# Patient Record
Sex: Female | Born: 1975 | Race: White | Hispanic: Yes | Marital: Married | Smoking: Former smoker
Health system: Southern US, Community
[De-identification: ages and names within clinical notes are randomized; demographics above are authoritative.]

## PROBLEM LIST (undated history)

## (undated) DIAGNOSIS — F419 Anxiety disorder, unspecified: Secondary | ICD-10-CM

## (undated) DIAGNOSIS — D069 Carcinoma in situ of cervix, unspecified: Secondary | ICD-10-CM

## (undated) HISTORY — PX: WISDOM TOOTH EXTRACTION: SHX21

## (undated) HISTORY — PX: LEEP: SHX91

## (undated) HISTORY — DX: Carcinoma in situ of cervix, unspecified: D06.9

## (undated) HISTORY — PX: SIGMOIDOSCOPY: SUR1295

## (undated) HISTORY — DX: Anxiety disorder, unspecified: F41.9

## (undated) HISTORY — PX: HYSTEROSCOPY: SHX211

---

## 1998-08-01 HISTORY — PX: CERVICAL BIOPSY  W/ LOOP ELECTRODE EXCISION: SUR135

## 2008-10-13 ENCOUNTER — Ambulatory Visit: Payer: Self-pay | Admitting: Gynecology

## 2008-10-13 ENCOUNTER — Encounter: Payer: Self-pay | Admitting: Gynecology

## 2008-10-13 ENCOUNTER — Other Ambulatory Visit: Admission: RE | Admit: 2008-10-13 | Discharge: 2008-10-13 | Payer: Self-pay | Admitting: Gynecology

## 2010-04-23 ENCOUNTER — Other Ambulatory Visit: Admission: RE | Admit: 2010-04-23 | Discharge: 2010-04-23 | Payer: Self-pay | Admitting: Gynecology

## 2010-04-23 ENCOUNTER — Ambulatory Visit: Payer: Self-pay | Admitting: Gynecology

## 2010-04-29 ENCOUNTER — Ambulatory Visit: Payer: Self-pay | Admitting: Gynecology

## 2011-04-18 ENCOUNTER — Telehealth: Payer: Self-pay | Admitting: *Deleted

## 2011-04-18 NOTE — Telephone Encounter (Signed)
Pt informed with the below note and will schedule appointment when able.

## 2011-04-18 NOTE — Telephone Encounter (Signed)
Pt called wanting a referral for a GI doctor and referral to exercise stress test that is done at [place]. Pt says she has been having some stomach pain at times. Also states chest pain while at her exercise class. I told pt that she needs to been seen by you,she is due for annual this month. Pt says she will schedule appointment in nov. I explained to pt that it would be best to see you first then you would be gladly give pt referral. Please advise.

## 2011-04-18 NOTE — Telephone Encounter (Signed)
Message copied by Aura Camps on Mon Apr 18, 2011 12:29 PM ------      Message from: Ok Edwards      Created: Mon Apr 18, 2011 12:20 PM       Victorino Dike, please tell patient that before can refer her to the gastroenterologists were cardiologist I need to evaluate her first and order some preliminary lab work as well. And she stated you know she is overdue for her annual exam which can check her at that point. If her annual spatula November she is to schedule an appointment to see me later this week or next week as to the symptoms prior to referral or additional testing.

## 2011-06-06 ENCOUNTER — Other Ambulatory Visit (HOSPITAL_COMMUNITY)
Admission: RE | Admit: 2011-06-06 | Discharge: 2011-06-06 | Disposition: A | Discharge status: Home / Self Care | Payer: BC Managed Care – PPO | Source: Ambulatory Visit | Attending: Obstetrics and Gynecology | Admitting: Obstetrics and Gynecology

## 2011-06-06 ENCOUNTER — Other Ambulatory Visit: Payer: Self-pay | Admitting: Obstetrics and Gynecology

## 2011-06-06 DIAGNOSIS — Z01419 Encounter for gynecological examination (general) (routine) without abnormal findings: Secondary | ICD-10-CM | POA: Insufficient documentation

## 2011-06-06 DIAGNOSIS — Z113 Encounter for screening for infections with a predominantly sexual mode of transmission: Secondary | ICD-10-CM | POA: Insufficient documentation

## 2012-06-07 ENCOUNTER — Encounter: Payer: BC Managed Care – PPO | Admitting: Gynecology

## 2012-06-19 ENCOUNTER — Other Ambulatory Visit (HOSPITAL_COMMUNITY)
Admission: RE | Admit: 2012-06-19 | Discharge: 2012-06-19 | Disposition: A | Discharge status: Home / Self Care | Payer: BC Managed Care – PPO | Source: Ambulatory Visit | Attending: Gynecology | Admitting: Gynecology

## 2012-06-19 ENCOUNTER — Ambulatory Visit (INDEPENDENT_AMBULATORY_CARE_PROVIDER_SITE_OTHER): Payer: BC Managed Care – PPO | Admitting: Gynecology

## 2012-06-19 ENCOUNTER — Encounter: Payer: Self-pay | Admitting: Gynecology

## 2012-06-19 VITALS — BP 104/68 | Ht 67.5 in | Wt 168.0 lb

## 2012-06-19 DIAGNOSIS — Z01419 Encounter for gynecological examination (general) (routine) without abnormal findings: Secondary | ICD-10-CM | POA: Insufficient documentation

## 2012-06-19 DIAGNOSIS — D069 Carcinoma in situ of cervix, unspecified: Secondary | ICD-10-CM | POA: Insufficient documentation

## 2012-06-19 DIAGNOSIS — K5900 Constipation, unspecified: Secondary | ICD-10-CM

## 2012-06-19 DIAGNOSIS — K602 Anal fissure, unspecified: Secondary | ICD-10-CM

## 2012-06-19 DIAGNOSIS — Z113 Encounter for screening for infections with a predominantly sexual mode of transmission: Secondary | ICD-10-CM

## 2012-06-19 DIAGNOSIS — Z1151 Encounter for screening for human papillomavirus (HPV): Secondary | ICD-10-CM | POA: Insufficient documentation

## 2012-06-19 NOTE — Progress Notes (Signed)
Helen Schmitt [birth year] [id number]   History:    36 y.o.  for annual gyn exam who has not been seen the office for several years. She stated that her last annual exam and Pap smear were normal in April 2012 hearing [place]. She states her cycles are regular. She in frequently does her self breast examination. She is using condoms for contraception. 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 She declined flu vaccine but was requesting a full STD screen. She stated that her family doctor at [facility] Dr. Leonette Most had done her lab work recently. She was also complaining of questionable hemorrhoids and constipation.  Past medical history,surgical history, family history and social history were all reviewed and documented in the EPIC chart.  Gynecologic History Patient's last menstrual period was 06/07/2012. Contraception: condoms Last Pap: 2012. Results were: normal Last mammogram: Not indicated. Results were: Not indicated  Obstetric History OB History    Grav Para Term Preterm Abortions TAB SAB Ect Mult Living   0                ROS: A ROS was performed and pertinent positives and negatives are included in the history.  GENERAL: No fevers or chills. HEENT: No change in vision, no earache, sore throat or sinus congestion. NECK: No pain or stiffness. CARDIOVASCULAR: No chest pain or pressure. No palpitations. PULMONARY: No shortness of breath, cough or wheeze. GASTROINTESTINAL: No abdominal pain, nausea, vomiting or diarrhea, melena or bright red blood per rectum. GENITOURINARY: No urinary frequency, urgency, hesitancy or dysuria. MUSCULOSKELETAL: No joint or muscle pain, no back pain, no recent trauma. DERMATOLOGIC: No rash, no itching, no lesions. ENDOCRINE: No polyuria, polydipsia, no heat or cold intolerance. No recent change in weight.  HEMATOLOGICAL: No anemia or easy bruising or bleeding. NEUROLOGIC: No headache, seizures, numbness, tingling or weakness. PSYCHIATRIC: No depression, no loss of interest in normal activity or change in sleep pattern.     Exam: chaperone present  BP 104/68  Ht 5' 7.5" (1.715 m)  Wt 168 lb (76.204 kg)  BMI 25.92 kg/m2  LMP 06/07/2012  Body mass index is 25.92 kg/(m^2).  General appearance : Well developed well nourished female. No acute distress HEENT: Neck supple, trachea midline, no carotid bruits, no thyroidmegaly Lungs: Clear to auscultation, no rhonchi or wheezes, or rib retractions  Heart: Regular rate and rhythm, no murmurs or gallops Breast:Examined in sitting and supine position were symmetrical in appearance, no palpable masses or tenderness,  no skin retraction, no nipple inversion, no nipple discharge, no skin discoloration, no axillary or supraclavicular lymphadenopathy Abdomen: no palpable masses or tenderness, no rebound or guarding Extremities: no edema or skin discoloration or tenderness  Pelvic:  Bartholin, Urethra, Skene Glands: Within normal limits             Vagina: No gross lesions or discharge  Cervix: No gross lesions or discharge  Uterus  anteverted, normal size, shape and consistency, non-tender and mobile  Adnexa  Without masses or tenderness  Anus and perineum  normal   Rectovaginal  normal sphincter tone small anal fissure at the 5:00 position slightly bleeding no hemorrhoids noted            Hemoccult not indicated     Assessment/Plan:  36 y.o. female for annual exam with history of constipation and small anal fissure  at the 5:00 position rectal mucosa. Since it was slightly bleeding silver nitrate was used. 2% lidocaine gel was then applied to relieve some of the discomfort. She will be placed on MiraLax to take 1 tablespoon with juice every day. She was instructed to increase her fluid intake as well as a high fiber vegetable diet. She would use  Preparation H twice a day for the next 7-10 days. If her symptoms do not improve she will need to return for further evaluation for possible referral to a gastroenterologist. A GC and Chlamydia culture along with hepatitis B and C. and RPR and HIV were obtained. 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 She was reminded to do her monthly self breast examination. Patient will obtain a copy of her record so that we can evaluate and update our charts.    Ok Edwards MD, 6:03 PM 06/19/2012

## 2012-06-19 NOTE — Patient Instructions (Addendum)
Breast Self-Awareness  Practicing breast self-awareness may pick up problems early, prevent significant medical complications, and possibly save your life. By practicing breast self-awareness, you can become familiar with how your breasts look and feel and if your breasts are changing. This allows you to notice changes early. It can also offer you some reassurance that your breast health is good. One way to learn what is normal for your breasts and whether your breasts are changing is to do a breast self-exam.  If you find a lump or something that was not present in the past, it is best to contact your caregiver right away. Other findings that should be evaluated by your caregiver include nipple discharge, especially if it is bloody; skin changes or reddening; areas where the skin seems to be pulled in (retracted); or new lumps and bumps. Breast pain is seldom associated with cancer (malignancy), but should also be evaluated by a caregiver.  BREAST SELF-EXAM  The best time to examine your breasts is 5 7 days after your menstrual period is over. During menstruation, the breasts are lumpier, and it may be more difficult to pick up changes. If you do not menstruate, have reached menopause, or had your uterus removed (hysterectomy), you should examine your breasts at regular intervals, such as monthly. If you are breastfeeding, examine your breasts after a feeding or after using a breast pump. Breast implants do not decrease the risk for lumps or tumors, so continue to perform breast self-exams as recommended. Talk to your caregiver about how to determine the difference between the implant and breast tissue. Also, talk about the amount of pressure you should use during the exam. Over time, you will become more familiar with the variations of your breasts and more comfortable with the exam. A breast self-exam requires you to remove all your clothes above the waist.    Look at your breasts and nipples. Stand in front of  a mirror in a room with good lighting. With your hands on your hips, push your hands firmly downward. Look for a difference in shape, contour, and size from one breast to the other (asymmetry). Asymmetry includes puckers, dips, or bumps. Also, look for skin changes, such as reddened or scaly areas on the breasts. Look for nipple changes, such as discharge, dimpling, repositioning, or redness.   Carefully feel your breasts. This is best done either in the shower or tub while using soapy water or when flat on your back. Place the arm (on the side of the breast you are examining) above your head. Use the pads (not the fingertips) of your three middle fingers on your opposite hand to feel your breasts. Start in the underarm area and use  inch (2 cm) overlapping circles to feel your breast. Use 3 different levels of pressure (light, medium, and firm pressure) at each circle before moving to the next circle. The light pressure is needed to feel the tissue closest to the skin. The medium pressure will help to feel breast tissue a little deeper, while the firm pressure is needed to feel the tissue close to the ribs. Continue the overlapping circles, moving downward over the breast until you feel your ribs below your breast. Then, move one finger-width towards the center of the body. Continue to use the  inch (2 cm) overlapping circles to feel your breast as you move slowly up toward the collar bone (clavicle) near the base of the neck. Continue the up and down exam using all 3 pressures   the chest. Do this with each breast, carefully feeling for lumps or changes.  Keep a written record with breast changes or normal findings for each breast. By writing this information down, you do not need to depend only on memory for size, tenderness, or location. Write down where you are in your menstrual cycle, if you are still menstruating.  Breast tissue can have some lumps or thick tissue. However,  see your caregiver if you find anything that concerns you.  SEEK MEDICAL CARE IF:  You see a change in shape, contour, or size of your breasts or nipples.   You see skin changes, such as reddened or scaly areas on the breasts or nipples.   You have an unusual discharge from your nipples.   You feel a new lump or unusually thick areas.  Document Released: 07/18/2005 Document Revised: 01/17/2012 Document Reviewed: 11/02/2011 [facility] Patient Information 2013 [place], [place]. Anal Fissure, Adult An anal fissure is a small tear or crack in the skin around the anus. Bleeding from a fissure usually stops on its own within a few minutes. However, bleeding will often reoccur with each bowel movement until the crack heals.  CAUSES   Passing large, hard stools.  Frequent diarrheal stools.  Constipation.  Inflammatory bowel disease (Crohn's disease or ulcerative colitis).  Infections.  Anal sex. SYMPTOMS   Small amounts of blood seen on your stools, on toilet paper, or in the toilet after a bowel movement.  Rectal bleeding.  Painful bowel movements.  Itching or irritation around the anus. DIAGNOSIS Your caregiver will examine the anal area. An anal fissure can usually be seen with careful inspection. A rectal exam may be performed and a short tube (anoscope) may be used to examine the anal canal. TREATMENT   You may be instructed to take fiber supplements. These supplements can soften your stool to help make bowel movements easier.  Sitz baths may be recommended to help heal the tear. Do not use soap in the sitz baths.  A medicated cream or ointment may be prescribed to lessen discomfort. HOME CARE INSTRUCTIONS   Maintain a diet high in fruits, whole grains, and vegetables. Avoid constipating foods like bananas and dairy products.  Take sitz baths as directed by your caregiver.  Drink enough fluids to keep your urine clear or pale yellow.  Only take over-the-counter or  prescription medicines for pain, discomfort, or fever as directed by your caregiver. Do not take aspirin as this may increase bleeding.  Do not use ointments containing numbing medications (anesthetics) or hydrocortisone. They could slow healing. SEEK MEDICAL CARE IF:   Your fissure is not completely healed within 3 days.  You have further bleeding.  You have a fever.  You have diarrhea mixed with blood.  You have pain.  Your problem is getting worse rather than better. MAKE SURE YOU:   Understand these instructions.  Will watch your condition.  Will get help right away if you are not doing well or get worse. Document Released: 07/18/2005 Document Revised: 10/10/2011 Document Reviewed: 01/02/2011 [facility] Patient Information 2013 [place], [place].

## 2012-06-20 LAB — GC/CHLAMYDIA PROBE AMP, GENITAL
Chlamydia, DNA Probe: NEGATIVE
GC Probe Amp, Genital: NEGATIVE

## 2012-06-20 LAB — HEPATITIS C ANTIBODY: HCV Ab: NEGATIVE

## 2012-06-20 LAB — HEPATITIS B SURFACE ANTIGEN: Hepatitis B Surface Ag: NEGATIVE

## 2012-06-25 ENCOUNTER — Telehealth: Payer: Self-pay | Admitting: *Deleted

## 2012-06-25 NOTE — Telephone Encounter (Signed)
Pt informed with all recent lab results on 06/19/12

## 2012-09-15 ENCOUNTER — Other Ambulatory Visit: Payer: Self-pay

## 2013-05-09 ENCOUNTER — Encounter: Payer: Self-pay | Admitting: Women's Health

## 2013-05-09 ENCOUNTER — Ambulatory Visit (INDEPENDENT_AMBULATORY_CARE_PROVIDER_SITE_OTHER): Payer: BC Managed Care – PPO | Admitting: Women's Health

## 2013-05-09 DIAGNOSIS — L723 Sebaceous cyst: Secondary | ICD-10-CM

## 2013-05-09 NOTE — Progress Notes (Signed)
Patient ID: Helen Schmitt, female   DOB: 1976/03/07, 37 y.o.   MRN: [id number] Presents with complaint of painful bump on the left labia. States it comes and goes. Same partner. Denies HSV, vaginal discharge or urinary symptoms. Regular monthly cycle.  Exam: Appears well. External genitalia left labia majora 1 cm tender sebaceous cyst, with pressure exuded small amount of white discharge.  Sebaceous cyst  Plan: Soak in warm tub, loose clothes, call if area persists or does not resolve. Keep scheduled annual exam with Dr. Lily Peer in November.

## 2013-05-10 ENCOUNTER — Ambulatory Visit: Payer: Self-pay | Admitting: Women's Health

## 2013-06-06 ENCOUNTER — Other Ambulatory Visit: Payer: Self-pay

## 2013-06-20 ENCOUNTER — Encounter: Payer: Self-pay | Admitting: Gynecology

## 2013-07-04 ENCOUNTER — Encounter: Payer: Self-pay | Admitting: Gynecology

## 2013-08-09 ENCOUNTER — Ambulatory Visit (INDEPENDENT_AMBULATORY_CARE_PROVIDER_SITE_OTHER): Payer: BC Managed Care – PPO | Admitting: Gynecology

## 2013-08-09 ENCOUNTER — Other Ambulatory Visit (HOSPITAL_COMMUNITY)
Admission: RE | Admit: 2013-08-09 | Discharge: 2013-08-09 | Disposition: A | Discharge status: Home / Self Care | Payer: BC Managed Care – PPO | Source: Ambulatory Visit | Attending: Gynecology | Admitting: Gynecology

## 2013-08-09 ENCOUNTER — Encounter: Payer: Self-pay | Admitting: Gynecology

## 2013-08-09 VITALS — BP 108/68 | Ht 67.0 in

## 2013-08-09 DIAGNOSIS — Z01419 Encounter for gynecological examination (general) (routine) without abnormal findings: Secondary | ICD-10-CM

## 2013-08-09 DIAGNOSIS — K602 Anal fissure, unspecified: Secondary | ICD-10-CM

## 2013-08-09 NOTE — Patient Instructions (Signed)
Anal Fissure, Adult  An anal fissure is a small tear or crack in the skin around the anus. Bleeding from a fissure usually stops on its own within a few minutes. However, bleeding will often reoccur with each bowel movement until the crack heals.   CAUSES    Passing large, hard stools.   Frequent diarrheal stools.   Constipation.   Inflammatory bowel disease (Crohn's disease or ulcerative colitis).   Infections.   Anal sex.  SYMPTOMS    Small amounts of blood seen on your stools, on toilet paper, or in the toilet after a bowel movement.   Rectal bleeding.   Painful bowel movements.   Itching or irritation around the anus.  DIAGNOSIS  Your caregiver will examine the anal area. An anal fissure can usually be seen with careful inspection. A rectal exam may be performed and a short tube (anoscope) may be used to examine the anal canal.  TREATMENT    You may be instructed to take fiber supplements. These supplements can soften your stool to help make bowel movements easier.   Sitz baths may be recommended to help heal the tear. Do not use soap in the sitz baths.   A medicated cream or ointment may be prescribed to lessen discomfort.  HOME CARE INSTRUCTIONS    Maintain a diet high in fruits, whole grains, and vegetables. Avoid constipating foods like bananas and dairy products.   Take sitz baths as directed by your caregiver.   Drink enough fluids to keep your urine clear or pale yellow.   Only take over-the-counter or prescription medicines for pain, discomfort, or fever as directed by your caregiver. Do not take aspirin as this may increase bleeding.   Do not use ointments containing numbing medications (anesthetics) or hydrocortisone. They could slow healing.  SEEK MEDICAL CARE IF:    Your fissure is not completely healed within 3 days.   You have further bleeding.   You have a fever.   You have diarrhea mixed with blood.   You have pain.   Your problem is getting worse rather than  better.  MAKE SURE YOU:    Understand these instructions.   Will watch your condition.   Will get help right away if you are not doing well or get worse.  Document Released: 07/18/2005 Document Revised: 10/10/2011 Document Reviewed: 01/02/2011  [employer] Patient Information 2014 [employer].

## 2013-08-09 NOTE — Progress Notes (Signed)
Helen Schmitt 08/01/1976 [id number]   History:    38 y.o.  for annual gyn exam  with complaint of persistent irritation and discomfort from her anal fissure. She stated that last year she saw a gastroenterologist in  [place], [place] who did a sigmoidoscopy and only described a small internal hemorrhoid and patient was given nitrate glycerin gel to apply but has had minimal improvement.  She is using condoms for contraception. 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 She declined a flu vaccine. She reports normal menstrual cycles.  Past medical history,surgical history, family history and social history were all reviewed and documented in the EPIC chart.  Gynecologic History Patient's last menstrual period was 08/02/2013. Contraception: condoms Last Pap:  2013. Results were: normal Last mammogram:  not indicated. Results were: not indicated   Obstetric History OB History  Gravida Para Term Preterm AB SAB TAB Ectopic Multiple Living  0                  ROS: A ROS was performed and pertinent positives and negatives are included in the history.  GENERAL: No fevers or chills. HEENT: No change in vision, no earache, sore throat or sinus congestion. NECK: No pain or stiffness. CARDIOVASCULAR: No chest pain or pressure. No palpitations. PULMONARY: No shortness of breath, cough or wheeze. GASTROINTESTINAL: No abdominal pain, nausea, vomiting or diarrhea, melena or bright red blood per rectum. GENITOURINARY: No urinary frequency, urgency, hesitancy or dysuria. MUSCULOSKELETAL: No joint or muscle pain, no back pain, no recent trauma. DERMATOLOGIC: No rash, no itching, no lesions. ENDOCRINE: No polyuria, polydipsia, no heat or cold intolerance. No recent change in weight. HEMATOLOGICAL: No anemia or easy bruising or bleeding. NEUROLOGIC: No headache, seizures, numbness, tingling or weakness.  PSYCHIATRIC: No depression, no loss of interest in normal activity or change in sleep pattern.     Exam: chaperone present  BP 108/68  Ht 5\' 7"  (1.702 m)  LMP 08/02/2013  There is no weight on file to calculate BMI.  General appearance : Well developed well nourished female. No acute distress HEENT: Neck supple, trachea midline, no carotid bruits, no thyroidmegaly Lungs: Clear to auscultation, no rhonchi or wheezes, or rib retractions  Heart: Regular rate and rhythm, no murmurs or gallops Breast:Examined in sitting and supine position were symmetrical in appearance, no palpable masses or tenderness,  no skin retraction, no nipple inversion, no nipple discharge, no skin discoloration, no axillary or supraclavicular lymphadenopathy Abdomen: no palpable masses or tenderness, no rebound or guarding Extremities: no edema or skin discoloration or tenderness  Pelvic:  Bartholin, Urethra, Skene Glands: Within normal limits             Vagina: No gross lesions or discharge  Cervix: No gross lesions or discharge  Uterus   anteverted , normal size, shape and consistency, non-tender and mobile  Adnexa  Without masses or tenderness  Anus and perineum :   Physical Exam  Genitourinary:     Rectovaginal  normal sphincter tone without palpated masses or tenderness             Hemoccult  not indicated      Assessment/Plan:  38 y.o. female for annual exam with persistent small fissure at the entrance to the anus. She is going to be referred to a colorectal surgeon. We discussed some treatment options that had been used  [place]seas that has had better results the nitroglycerin gel such as topical bethanechol or diltiazem. Patient is going to wait and wait for the consult with a colorectal surgeon. Her Pap smear was done today. Her PCP has been doing her blood work. We discussed importance of monthly self breast examination.  Note: This dictation was prepared with  Dragon/digital dictation along  withSmart phrase technology. Any transcriptional errors that result from this process are unintentional.   Ok EdwardsFERNANDEZ,JUAN H MD, 5:24 PM 08/09/2013

## 2013-08-15 ENCOUNTER — Telehealth: Payer: Self-pay

## 2013-08-15 NOTE — Telephone Encounter (Signed)
Patient called for pap smear results.  Informed pap normal.

## 2013-08-31 ENCOUNTER — Encounter: Payer: Self-pay | Admitting: *Deleted

## 2018-08-31 ENCOUNTER — Encounter: Payer: Self-pay | Admitting: Obstetrics & Gynecology

## 2018-08-31 ENCOUNTER — Ambulatory Visit (INDEPENDENT_AMBULATORY_CARE_PROVIDER_SITE_OTHER): Payer: 59 | Admitting: Obstetrics & Gynecology

## 2018-08-31 VITALS — BP 118/74 | Ht 67.0 in | Wt 189.5 lb

## 2018-08-31 DIAGNOSIS — Z01419 Encounter for gynecological examination (general) (routine) without abnormal findings: Secondary | ICD-10-CM | POA: Diagnosis not present

## 2018-08-31 DIAGNOSIS — F419 Anxiety disorder, unspecified: Secondary | ICD-10-CM

## 2018-08-31 DIAGNOSIS — Z1151 Encounter for screening for human papillomavirus (HPV): Secondary | ICD-10-CM

## 2018-08-31 DIAGNOSIS — Z3009 Encounter for other general counseling and advice on contraception: Secondary | ICD-10-CM | POA: Diagnosis not present

## 2018-08-31 DIAGNOSIS — E663 Overweight: Secondary | ICD-10-CM

## 2018-08-31 MED ORDER — ALPRAZOLAM 0.5 MG PO TABS: 0.2500 mg | ORAL_TABLET | Freq: Every day | ORAL | 0 refills | Status: DC | PRN

## 2018-08-31 NOTE — Patient Instructions (Signed)
1. Encounter for routine gynecological examination with Papanicolaou smear of cervix Normal gynecologic exam.  Pap with high-risk HPV done today.  Breast exam normal.  Will schedule a screening mammogram at the breast center now.  Will establish with a family physician for health labs.  2. Encounter for other general counseling or advice on contraception Patient using condoms and withdrawal for many years.  Counseling done about use of condoms covering ovulatory period safely.  Better contraceptive methods discussed with patient including ParaGard IUD given that she prefers no hormonal contraception.  ParaGard pamphlet given.  3. Anxiety Rare episodes of anxiety and panic attacks.  Alprazolam 0.5 mg as needed prescribed.  4. Overweight (BMI 25.0-29.9) We will restart on fitness activities, recommend aerobic activities 5 times a week and weightlifting every 2 days.  Recommend a lower calorie/carb diet such as [employer].  Other orders - ALPRAZolam (XANAX) 0.5 MG tablet; Take 0.5 tablets (0.25 mg total) by mouth daily as needed for anxiety.  Helen Schmitt, it was a pleasure meeting you today!  I will inform you of your results as soon as they are available.

## 2018-08-31 NOTE — Addendum Note (Signed)
Addended by: Berna Spare A on: 08/31/2018 11:07 AM   Modules accepted: Orders

## 2018-08-31 NOTE — Progress Notes (Signed)
Helen Schmitt [birth year] [id number]   History:    43 y.o. G0 Married.  Recently moved back to [place]  RP:  New patient presenting for annual gyn exam   HPI: Menses regular every month with normal flow.  No breakthrough bleeding.  No pelvic pain.  No pain with intercourse, except some discomfort if penetration is very deep.  Urine and bowel movements normal.  Breasts normal.  Will find a family physician for health labs.  Will schedule screening mammogram now.  Remote history of LEEPs, per patient around 2007.  Pap tests normal since then.  Past medical history,surgical history, family history and social history were all reviewed and documented in the EPIC chart.  Gynecologic History Patient's last menstrual period was 08/10/2018. Contraception: coitus interruptus and condoms Last Pap: 08/2017. Results were: normal Last mammogram: 2018. Results were: normal Bone Density: Never Colonoscopy: Never  Obstetric History OB History  Gravida Para Term Preterm AB Living  0            SAB TAB Ectopic Multiple Live Births                ROS: A ROS was performed and pertinent positives and negatives are included in the history.  GENERAL: No fevers or chills. HEENT: No change in vision, no earache, sore throat or sinus congestion. NECK: No pain or stiffness. CARDIOVASCULAR: No chest pain or pressure. No palpitations. PULMONARY: No shortness of breath, cough or wheeze. GASTROINTESTINAL: No abdominal pain, nausea, vomiting or diarrhea, melena or bright red blood per rectum. GENITOURINARY: No urinary frequency, urgency, hesitancy or dysuria. MUSCULOSKELETAL: No joint or muscle pain, no back pain, no recent trauma. DERMATOLOGIC: No rash, no itching, no lesions. ENDOCRINE: No polyuria, polydipsia, no heat or cold intolerance. No recent change in weight. HEMATOLOGICAL: No anemia or easy bruising or bleeding. NEUROLOGIC: No headache, seizures, numbness, tingling or weakness. PSYCHIATRIC: No  depression, no loss of interest in normal activity or change in sleep pattern.     Exam:   BP 118/74   Ht 5\' 7"  (1.702 m)   Wt 189 lb 8 oz (86 kg)   LMP 08/10/2018   BMI 29.68 kg/m   Body mass index is 29.68 kg/m.  General appearance : Well developed well nourished female. No acute distress HEENT: Eyes: no retinal hemorrhage or exudates,  Neck supple, trachea midline, no carotid bruits, no thyroidmegaly Lungs: Clear to auscultation, no rhonchi or wheezes, or rib retractions  Heart: Regular rate and rhythm, no murmurs or gallops Breast:Examined in sitting and supine position were symmetrical in appearance, no palpable masses or tenderness,  no skin retraction, no nipple inversion, no nipple discharge, no skin discoloration, no axillary or supraclavicular lymphadenopathy Abdomen: no palpable masses or tenderness, no rebound or guarding Extremities: no edema or skin discoloration or tenderness  Pelvic: Vulva: Normal             Vagina: No gross lesions or discharge  Cervix: No gross lesions or discharge.  Pap/HPV HR done  Uterus  AV, normal size, shape and consistency, non-tender and mobile  Adnexa  Without masses or tenderness  Anus: Normal   Assessment/Plan:  43 y.o. female for annual exam   1. Encounter for routine gynecological examination with Papanicolaou smear of cervix Normal gynecologic exam.  Pap with high-risk HPV done today.  Breast exam normal.  Will schedule a screening mammogram at the breast center now.  Will establish with a family physician for health labs.  2. Encounter for  other general counseling or advice on contraception Patient using condoms and withdrawal for many years.  Counseling done about use of condoms covering ovulatory period safely.  Better contraceptive methods discussed with patient including ParaGard IUD given that she prefers no hormonal contraception.  ParaGard pamphlet given.  3. Anxiety Rare episodes of anxiety and panic attacks.   Alprazolam 0.5 mg as needed prescribed.  4. Overweight (BMI 25.0-29.9) We will restart on fitness activities, recommend aerobic activities 5 times a week and weightlifting every 2 days.  Recommend a lower calorie/carb diet such as [employer].  Other orders - ALPRAZolam (XANAX) 0.5 MG tablet; Take 0.5 tablets (0.25 mg total) by mouth daily as needed for anxiety.  [name] MD, 8:37 AM 08/31/2018

## 2018-09-03 LAB — PAP, TP IMAGING W/ HPV RNA, RFLX HPV TYPE 16,18/45: HPV DNA HIGH RISK: NOT DETECTED

## 2019-09-10 ENCOUNTER — Encounter: Payer: 59 | Admitting: Obstetrics & Gynecology

## 2019-09-25 ENCOUNTER — Other Ambulatory Visit: Payer: Self-pay

## 2019-09-26 ENCOUNTER — Encounter: Payer: Self-pay | Admitting: *Deleted

## 2019-09-26 ENCOUNTER — Encounter: Payer: Self-pay | Admitting: Obstetrics & Gynecology

## 2019-09-26 ENCOUNTER — Ambulatory Visit (INDEPENDENT_AMBULATORY_CARE_PROVIDER_SITE_OTHER): Payer: 59 | Admitting: Obstetrics & Gynecology

## 2019-09-26 VITALS — BP 126/80 | Ht 67.0 in | Wt 200.0 lb

## 2019-09-26 DIAGNOSIS — Z789 Other specified health status: Secondary | ICD-10-CM | POA: Diagnosis not present

## 2019-09-26 DIAGNOSIS — Z01419 Encounter for gynecological examination (general) (routine) without abnormal findings: Secondary | ICD-10-CM

## 2019-09-26 DIAGNOSIS — E6609 Other obesity due to excess calories: Secondary | ICD-10-CM

## 2019-09-26 DIAGNOSIS — Z6831 Body mass index (BMI) 31.0-31.9, adult: Secondary | ICD-10-CM | POA: Diagnosis not present

## 2019-09-26 NOTE — Progress Notes (Signed)
Helen Schmitt 10/14/1975 [id number]   History:    44 y.o. G0 Married.  Moved back to [place] last year.  RP:  Established patient presenting for annual gyn exam   HPI: Menses regular every month with normal flow.  No breakthrough bleeding.  No pelvic pain currently, but intermittent discomfort towards the right. Using condoms.  No pain with intercourse, except some discomfort if penetration is very deep.  Urine and bowel movements normal.  Breasts normal.  Will help patient find a family physician for health labs.  Will schedule screening mammogram now.  Remote history of LEEPs, per patient around 2007.  Pap tests normal since then.  BMI 31.32.  Planning to improve nutrition and fitness.   Past medical history,surgical history, family history and social history were all reviewed and documented in the EPIC chart.  Gynecologic History Patient's last menstrual period was 09/02/2019 (within days).  Obstetric History OB History  Gravida Para Term Preterm AB Living  0            SAB TAB Ectopic Multiple Live Births                ROS: A ROS was performed and pertinent positives and negatives are included in the history.  GENERAL: No fevers or chills. HEENT: No change in vision, no earache, sore throat or sinus congestion. NECK: No pain or stiffness. CARDIOVASCULAR: No chest pain or pressure. No palpitations. PULMONARY: No shortness of breath, cough or wheeze. GASTROINTESTINAL: No abdominal pain, nausea, vomiting or diarrhea, melena or bright red blood per rectum. GENITOURINARY: No urinary frequency, urgency, hesitancy or dysuria. MUSCULOSKELETAL: No joint or muscle pain, no back pain, no recent trauma. DERMATOLOGIC: No rash, no itching, no lesions. ENDOCRINE: No polyuria, polydipsia, no heat or cold intolerance. No recent change in weight. HEMATOLOGICAL: No anemia or easy bruising or bleeding. NEUROLOGIC: No headache, seizures, numbness, tingling or weakness. PSYCHIATRIC: No depression,  no loss of interest in normal activity or change in sleep pattern.     Exam:   BP 126/80 (BP Location: Right Arm, Patient Position: Sitting, Cuff Size: Normal)   Ht 5\' 7"  (1.702 m)   Wt 200 lb (90.7 kg)   LMP 09/02/2019 (Within Days)   BMI 31.32 kg/m   Body mass index is 31.32 kg/m.  General appearance : Well developed well nourished female. No acute distress HEENT: Eyes: no retinal hemorrhage or exudates,  Neck supple, trachea midline, no carotid bruits, no thyroidmegaly Lungs: Clear to auscultation, no rhonchi or wheezes, or rib retractions  Heart: Regular rate and rhythm, no murmurs or gallops Breast:Examined in sitting and supine position were symmetrical in appearance, no palpable masses or tenderness,  no skin retraction, no nipple inversion, no nipple discharge, no skin discoloration, no axillary or supraclavicular lymphadenopathy Abdomen: no palpable masses or tenderness, no rebound or guarding Extremities: no edema or skin discoloration or tenderness  Pelvic: Vulva: Normal             Vagina: No gross lesions or discharge  Cervix: No gross lesions or discharge.  Pap/HPV HR done.  Uterus  AV, normal size, shape and consistency, non-tender and mobile  Adnexa  Without masses or tenderness  Anus: Normal   Assessment/Plan:  43 y.o. female for annual exam   1. Encounter for routine gynecological examination with Papanicolaou smear of cervix Normal gynecologic exam.  Pap test with high-risk HPV done.  Breast exam normal.  Will schedule a screening mammogram.  Health labs with family physician  when establishes. - PAP,TP IMGw/HPV RNA,rflx [id number]  2. Use of condoms for contraception  3. Class 1 obesity due to excess calories without serious comorbidity with body mass index (BMI) of 31.0 to 31.9 in adult Recommend a lower calorie/carb diet such as [employer].  Aerobic physical activities 5 times a week and light weightlifting every 2 days.  Princess Bruins  MD, 8:08 AM 09/26/2019

## 2019-09-27 LAB — PAP, TP IMAGING W/ HPV RNA, RFLX HPV TYPE 16,18/45: HPV DNA High Risk: NOT DETECTED

## 2019-09-28 ENCOUNTER — Encounter: Payer: Self-pay | Admitting: Obstetrics & Gynecology

## 2019-09-28 NOTE — Patient Instructions (Signed)
1. Encounter for routine gynecological examination with Papanicolaou smear of cervix Normal gynecologic exam.  Pap test with high-risk HPV done.  Breast exam normal.  Will schedule a screening mammogram.  Health labs with family physician when establishes. - PAP,TP IMGw/HPV RNA,rflx [id number]/45  2. Use of condoms for contraception  3. Class 1 obesity due to excess calories without serious comorbidity with body mass index (BMI) of 31.0 to 31.9 in adult Recommend a lower calorie/carb diet such as [employer].  Aerobic physical activities 5 times a week and light weightlifting every 2 days.  Helen Schmitt, it was a pleasure seeing you today!  I will inform you of your results as soon as they are available.

## 2019-10-01 ENCOUNTER — Other Ambulatory Visit: Payer: Self-pay

## 2019-10-01 NOTE — Telephone Encounter (Signed)
Per note "Rare episodes of anxiety and panic attacks.  Alprazolam 0.5 mg as needed prescribed."   I will pend Rx for you to sign

## 2019-10-02 MED ORDER — ALPRAZOLAM 0.5 MG PO TABS: 0.2500 mg | ORAL_TABLET | Freq: Every day | ORAL | 2 refills | Status: DC | PRN

## 2020-07-03 ENCOUNTER — Other Ambulatory Visit: Payer: Self-pay | Admitting: Obstetrics & Gynecology

## 2020-07-03 DIAGNOSIS — Z0000 Encounter for general adult medical examination without abnormal findings: Secondary | ICD-10-CM

## 2020-08-18 ENCOUNTER — Ambulatory Visit: Payer: Self-pay

## 2020-09-07 ENCOUNTER — Other Ambulatory Visit: Payer: Self-pay

## 2020-09-07 ENCOUNTER — Ambulatory Visit
Admission: RE | Admit: 2020-09-07 | Discharge: 2020-09-07 | Disposition: A | Discharge status: Home / Self Care | Payer: 59 | Source: Ambulatory Visit | Attending: Obstetrics & Gynecology | Admitting: Obstetrics & Gynecology

## 2020-09-07 DIAGNOSIS — Z0000 Encounter for general adult medical examination without abnormal findings: Secondary | ICD-10-CM

## 2020-10-02 ENCOUNTER — Encounter: Payer: 59 | Admitting: Obstetrics & Gynecology

## 2020-10-30 ENCOUNTER — Ambulatory Visit: Payer: 59 | Admitting: Obstetrics & Gynecology

## 2020-11-04 ENCOUNTER — Other Ambulatory Visit (HOSPITAL_COMMUNITY)
Admission: RE | Admit: 2020-11-04 | Discharge: 2020-11-04 | Disposition: A | Discharge status: Home / Self Care | Payer: 59 | Source: Ambulatory Visit | Attending: Obstetrics & Gynecology | Admitting: Obstetrics & Gynecology

## 2020-11-04 ENCOUNTER — Encounter: Payer: Self-pay | Admitting: Obstetrics & Gynecology

## 2020-11-04 ENCOUNTER — Other Ambulatory Visit: Payer: Self-pay

## 2020-11-04 ENCOUNTER — Ambulatory Visit (INDEPENDENT_AMBULATORY_CARE_PROVIDER_SITE_OTHER): Payer: 59 | Admitting: Obstetrics & Gynecology

## 2020-11-04 VITALS — BP 118/70 | Ht 66.75 in | Wt 203.0 lb

## 2020-11-04 DIAGNOSIS — E6609 Other obesity due to excess calories: Secondary | ICD-10-CM | POA: Diagnosis not present

## 2020-11-04 DIAGNOSIS — Z01419 Encounter for gynecological examination (general) (routine) without abnormal findings: Secondary | ICD-10-CM

## 2020-11-04 DIAGNOSIS — Z789 Other specified health status: Secondary | ICD-10-CM

## 2020-11-04 DIAGNOSIS — F419 Anxiety disorder, unspecified: Secondary | ICD-10-CM

## 2020-11-04 DIAGNOSIS — Z6832 Body mass index (BMI) 32.0-32.9, adult: Secondary | ICD-10-CM

## 2020-11-04 MED ORDER — ALPRAZOLAM 0.5 MG PO TABS: 0.2500 mg | ORAL_TABLET | Freq: Every day | ORAL | 0 refills | Status: DC | PRN

## 2020-11-04 NOTE — Addendum Note (Signed)
Addended by: Berna Spare A on: 11/04/2020 09:57 AM   Modules accepted: Orders

## 2020-11-04 NOTE — Addendum Note (Signed)
Addended by: [name] on: 11/04/2020 05:01 PM   Modules accepted: Orders

## 2020-11-04 NOTE — Progress Notes (Signed)
Helen Schmitt 1975-12-03 [id number]   History:    45 y.o. G0 Married Moved back to [place] last year.  ZH:[id number] patient presenting for annual gyn exam   XBM:WUXLKG regular every month with normal flow. No breakthrough bleeding. No pelvic pain. Using condoms.  No pain with intercourse. Urine and bowel movements normal. Breasts normal. Will help patient find a family physician for health labs. Will schedule screening mammogram now. Remote history of LEEPs,per patient around 2007. Pap testsnormal since then.  BMI 32.03.  Planning to improve nutrition and fitness.  Health labs with Fam MD.  Past medical history,surgical history, family history and social history were all reviewed and documented in the EPIC chart.  Gynecologic History Patient's last menstrual period was 10/12/2020.  Obstetric History OB History  Gravida Para Term Preterm AB Living  0            SAB IAB Ectopic Multiple Live Births                ROS: A ROS was performed and pertinent positives and negatives are included in the history.  GENERAL: No fevers or chills. HEENT: No change in vision, no earache, sore throat or sinus congestion. NECK: No pain or stiffness. CARDIOVASCULAR: No chest pain or pressure. No palpitations. PULMONARY: No shortness of breath, cough or wheeze. GASTROINTESTINAL: No abdominal pain, nausea, vomiting or diarrhea, melena or bright red blood per rectum. GENITOURINARY: No urinary frequency, urgency, hesitancy or dysuria. MUSCULOSKELETAL: No joint or muscle pain, no back pain, no recent trauma. DERMATOLOGIC: No rash, no itching, no lesions. ENDOCRINE: No polyuria, polydipsia, no heat or cold intolerance. No recent change in weight. HEMATOLOGICAL: No anemia or easy bruising or bleeding. NEUROLOGIC: No headache, seizures, numbness, tingling or weakness. PSYCHIATRIC: No depression, no loss of interest in normal activity or change in sleep pattern.     Exam:   BP 118/70   Ht  5' 6.75" (1.695 m)   Wt 203 lb (92.1 kg)   LMP 10/12/2020   BMI 32.03 kg/m   Body mass index is 32.03 kg/m.  General appearance : Well developed well nourished female. No acute distress HEENT: Eyes: no retinal hemorrhage or exudates,  Neck supple, trachea midline, no carotid bruits, no thyroidmegaly Lungs: Clear to auscultation, no rhonchi or wheezes, or rib retractions  Heart: Regular rate and rhythm, no murmurs or gallops Breast:Examined in sitting and supine position were symmetrical in appearance, no palpable masses or tenderness,  no skin retraction, no nipple inversion, no nipple discharge, no skin discoloration, no axillary or supraclavicular lymphadenopathy Abdomen: no palpable masses or tenderness, no rebound or guarding Extremities: no edema or skin discoloration or tenderness  Pelvic: Vulva: Normal             Vagina: No gross lesions or discharge  Cervix: No gross lesions or discharge.  Pap reflex done.  Uterus  AV, normal size, shape and consistency, non-tender and mobile  Adnexa  Without masses or tenderness  Anus: Normal   Assessment/Plan:  45 y.o. female for annual exam   1. Encounter for routine gynecological examination with Papanicolaou smear of cervix Normal gynecologic exam.  Pap reflex done.  Breast exam normal.  Screening mammogram February 2022 was negative.  Health labs with family physician.  2. Use of condoms for contraception  3. Anxiety Using Xanax sparingly.  Prescription sent to pharmacy.  4. Class 1 obesity due to excess calories without serious comorbidity with body mass index (BMI) of 32.0 to 32.9 in  adult Will start a low calorie/carb diet.  Intermittent fasting recommended.  Aerobic activities 5 times a week and light weightlifting every 2 days.  Other orders - ALPRAZolam (XANAX) 0.5 MG tablet; Take 0.5 tablets (0.25 mg total) by mouth daily as needed for anxiety.  [name] MD, 8:38 AM 11/04/2020

## 2020-11-04 NOTE — Addendum Note (Signed)
Addended by: Berna Spare A on: 11/04/2020 10:00 AM   Modules accepted: Orders

## 2020-11-06 LAB — CYTOLOGY - PAP: Diagnosis: NEGATIVE

## 2020-11-09 ENCOUNTER — Encounter: Payer: Self-pay | Admitting: *Deleted

## 2021-07-12 ENCOUNTER — Encounter: Payer: Self-pay | Admitting: Obstetrics & Gynecology

## 2021-07-13 NOTE — Telephone Encounter (Signed)
Okay to place referral

## 2021-10-08 ENCOUNTER — Other Ambulatory Visit: Payer: Self-pay | Admitting: Obstetrics & Gynecology

## 2021-10-08 DIAGNOSIS — Z1231 Encounter for screening mammogram for malignant neoplasm of breast: Secondary | ICD-10-CM

## 2021-10-12 ENCOUNTER — Ambulatory Visit: Payer: 59

## 2021-10-14 ENCOUNTER — Ambulatory Visit
Admission: RE | Admit: 2021-10-14 | Discharge: 2021-10-14 | Disposition: A | Discharge status: Home / Self Care | Payer: 59 | Source: Ambulatory Visit | Attending: Obstetrics & Gynecology | Admitting: Obstetrics & Gynecology

## 2021-10-31 ENCOUNTER — Other Ambulatory Visit: Payer: Self-pay | Admitting: Obstetrics & Gynecology

## 2021-10-31 ENCOUNTER — Encounter: Payer: Self-pay | Admitting: Obstetrics & Gynecology

## 2021-11-01 NOTE — Telephone Encounter (Signed)
Patient comment: Hi there - my current prescription expires this week and I have had to take some due to anxiety over having Covid.  Can you please send a refill for me to pick up at my local [facility] pharmacy ASAP? ?

## 2021-11-02 MED ORDER — ALPRAZOLAM 0.5 MG PO TABS: 0.2500 mg | ORAL_TABLET | Freq: Every day | ORAL | 0 refills | Status: DC | PRN

## 2021-11-10 ENCOUNTER — Ambulatory Visit: Payer: 59 | Admitting: Obstetrics & Gynecology

## 2021-12-16 ENCOUNTER — Encounter: Payer: Self-pay | Admitting: Obstetrics & Gynecology

## 2021-12-16 ENCOUNTER — Ambulatory Visit (INDEPENDENT_AMBULATORY_CARE_PROVIDER_SITE_OTHER): Payer: 59 | Admitting: Obstetrics & Gynecology

## 2021-12-16 ENCOUNTER — Other Ambulatory Visit (HOSPITAL_COMMUNITY)
Admission: RE | Admit: 2021-12-16 | Discharge: 2021-12-16 | Disposition: A | Discharge status: Home / Self Care | Payer: 59 | Source: Ambulatory Visit | Attending: Obstetrics & Gynecology | Admitting: Obstetrics & Gynecology

## 2021-12-16 VITALS — BP 104/64 | HR 83 | Resp 16 | Ht 66.5 in | Wt 193.0 lb

## 2021-12-16 DIAGNOSIS — Z01419 Encounter for gynecological examination (general) (routine) without abnormal findings: Secondary | ICD-10-CM | POA: Diagnosis present

## 2021-12-16 DIAGNOSIS — Z6830 Body mass index (BMI) 30.0-30.9, adult: Secondary | ICD-10-CM

## 2021-12-16 DIAGNOSIS — E6609 Other obesity due to excess calories: Secondary | ICD-10-CM

## 2021-12-16 DIAGNOSIS — Z789 Other specified health status: Secondary | ICD-10-CM | POA: Diagnosis not present

## 2021-12-16 NOTE — Progress Notes (Signed)
Helen Schmitt 04-25-1976 [id number]   History:    46 y.o. G0 Married Moved back to [place] x 2 years.   RP:  Established patient presenting for annual gyn exam    HPI: Menses regular every month with normal flow.  No breakthrough bleeding.  No pelvic pain. Using condoms.  No pain with intercourse when using a lubricant. Pap Neg 10/2020. Urine and bowel movements normal. Breasts normal. Mammo Neg 09/2021.  Remote history of LEEPs, per patient around 2007.  Pap tests normal since then. Pap Neg 10/2020.  Pap reflex today. BMI decreased to 30.68.  Improved nutrition and fitness.  Health labs with Fam MD.   Past medical history,surgical history, family history and social history were all reviewed and documented in the EPIC chart.  Gynecologic History Patient's last menstrual period was 11/25/2021 (exact date).  Obstetric History OB History  Gravida Para Term Preterm AB Living  0            SAB IAB Ectopic Multiple Live Births                ROS: A ROS was performed and pertinent positives and negatives are included in the history. GENERAL: No fevers or chills. HEENT: No change in vision, no earache, sore throat or sinus congestion. NECK: No pain or stiffness. CARDIOVASCULAR: No chest pain or pressure. No palpitations. PULMONARY: No shortness of breath, cough or wheeze. GASTROINTESTINAL: No abdominal pain, nausea, vomiting or diarrhea, melena or bright red blood per rectum. GENITOURINARY: No urinary frequency, urgency, hesitancy or dysuria. MUSCULOSKELETAL: No joint or muscle pain, no back pain, no recent trauma. DERMATOLOGIC: No rash, no itching, no lesions. ENDOCRINE: No polyuria, polydipsia, no heat or cold intolerance. No recent change in weight. HEMATOLOGICAL: No anemia or easy bruising or bleeding. NEUROLOGIC: No headache, seizures, numbness, tingling or weakness. PSYCHIATRIC: No depression, no loss of interest in normal activity or change in sleep pattern.     Exam:   BP 104/64    Pulse 83   Resp 16   Ht 5' 6.5" (1.689 m)   Wt 193 lb (87.5 kg)   LMP 11/25/2021 (Exact Date)   BMI 30.68 kg/m   Body mass index is 30.68 kg/m.  General appearance : Well developed well nourished female. No acute distress HEENT: Eyes: no retinal hemorrhage or exudates,  Neck supple, trachea midline, no carotid bruits, no thyroidmegaly Lungs: Clear to auscultation, no rhonchi or wheezes, or rib retractions  Heart: Regular rate and rhythm, no murmurs or gallops Breast:Examined in sitting and supine position were symmetrical in appearance, no palpable masses or tenderness,  no skin retraction, no nipple inversion, no nipple discharge, no skin discoloration, no axillary or supraclavicular lymphadenopathy Abdomen: no palpable masses or tenderness, no rebound or guarding Extremities: no edema or skin discoloration or tenderness  Pelvic: Vulva: Normal             Vagina: No gross lesions or discharge  Cervix: No gross lesions or discharge.  Pap reflex done.  Uterus  AV, normal size, shape and consistency, non-tender and mobile  Adnexa  Without masses or tenderness  Anus: Normal   Assessment/Plan:  46 y.o. female for annual exam   1. Encounter for routine gynecological examination with Papanicolaou smear of cervix Menses regular every month with normal flow.  No breakthrough bleeding.  No pelvic pain. Using condoms.  No pain with intercourse when using a lubricant. Pap Neg 10/2020. Urine and bowel movements normal. Breasts normal. Mammo Neg 09/2021.  Remote history of LEEPs, per patient around 2007.  Pap tests normal since then. Pap Neg 10/2020.  Pap reflex today. BMI decreased to 30.68.  Improved nutrition and fitness.  Health labs with Fam MD. - Cytology - PAP( )  2. Use of condoms for contraception  3. Class 1 obesity due to excess calories without serious comorbidity with body mass index (BMI) of 30.0 to 30.9 in adult  BMI decreased to 30.68.  Improved nutrition and fitness.    [name] MD, 4:07 PM 12/16/2021

## 2021-12-17 LAB — CYTOLOGY - PAP
Adequacy: ABSENT
Diagnosis: NEGATIVE

## 2022-08-05 IMAGING — MG MM DIGITAL SCREENING BILAT W/ TOMO AND CAD
8 series · 8 of 24 positions shown · non-contrast
Comparison: Previous exam(s).

CLINICAL DATA: Screening.

EXAM:
DIGITAL SCREENING BILATERAL MAMMOGRAM WITH TOMOSYNTHESIS AND CAD
TECHNIQUE: Bilateral screening digital craniocaudal and mediolateral oblique
mammograms were obtained. Bilateral screening digital breast
tomosynthesis was performed. The images were evaluated with
computer-aided detection.

[L CC synth-2D]
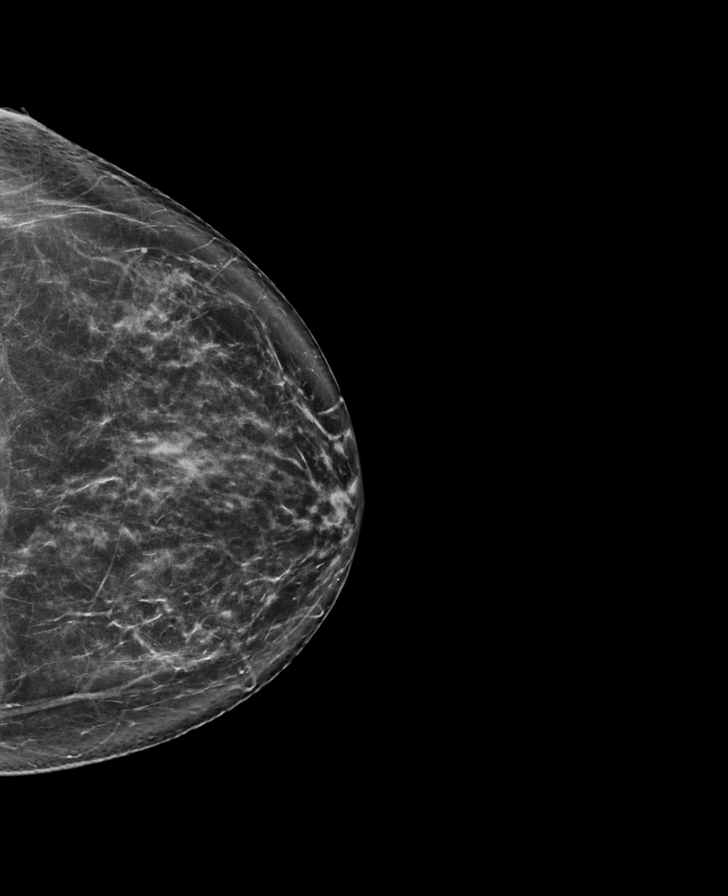

[R CC synth-2D]
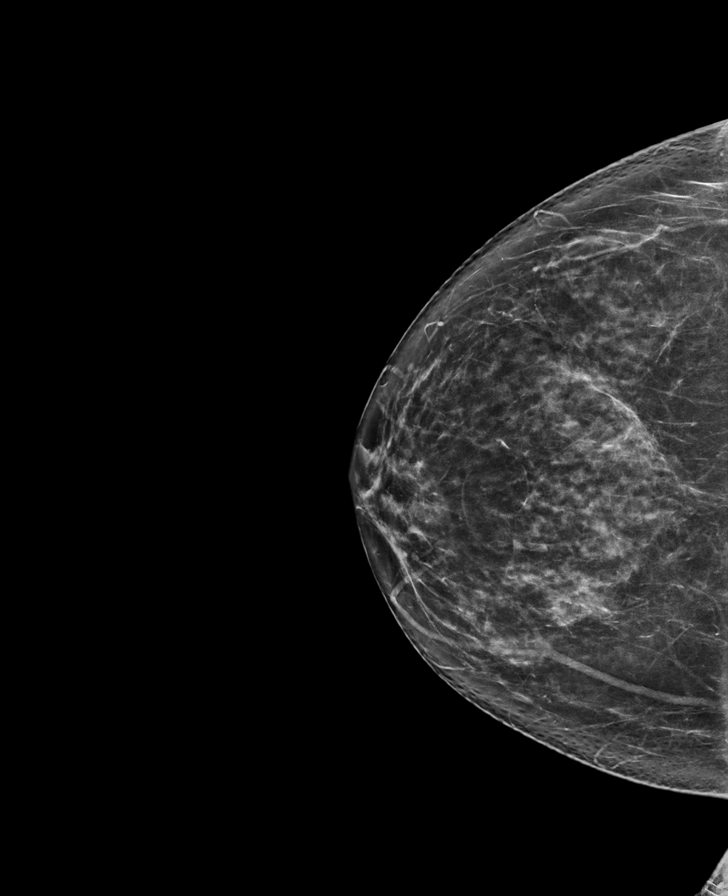

[R MLO synth-2D]
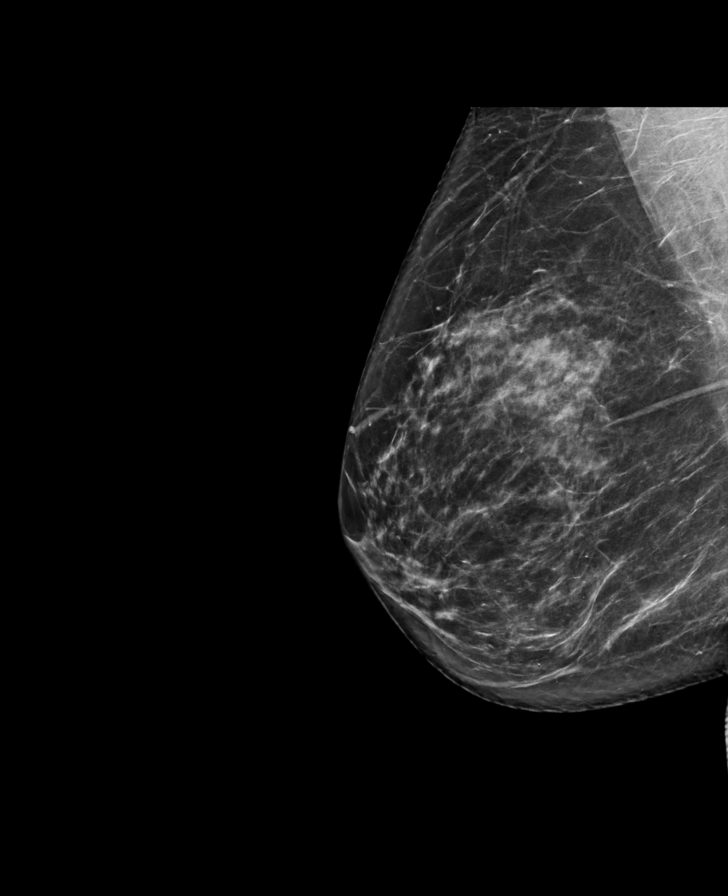

[L MLO synth-2D]
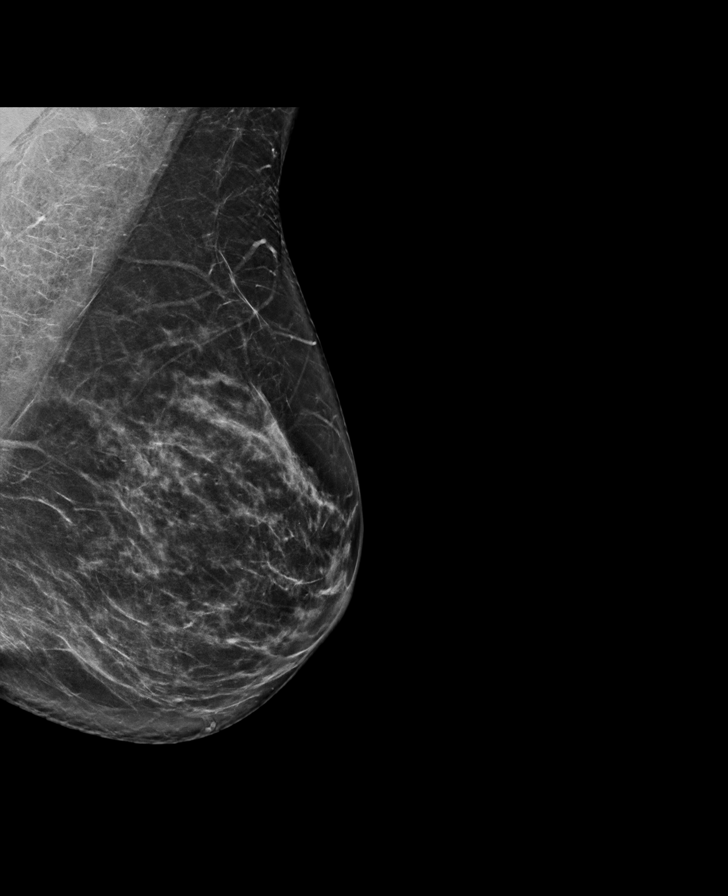

[R CC tomo · tomo slice 40/79.0]
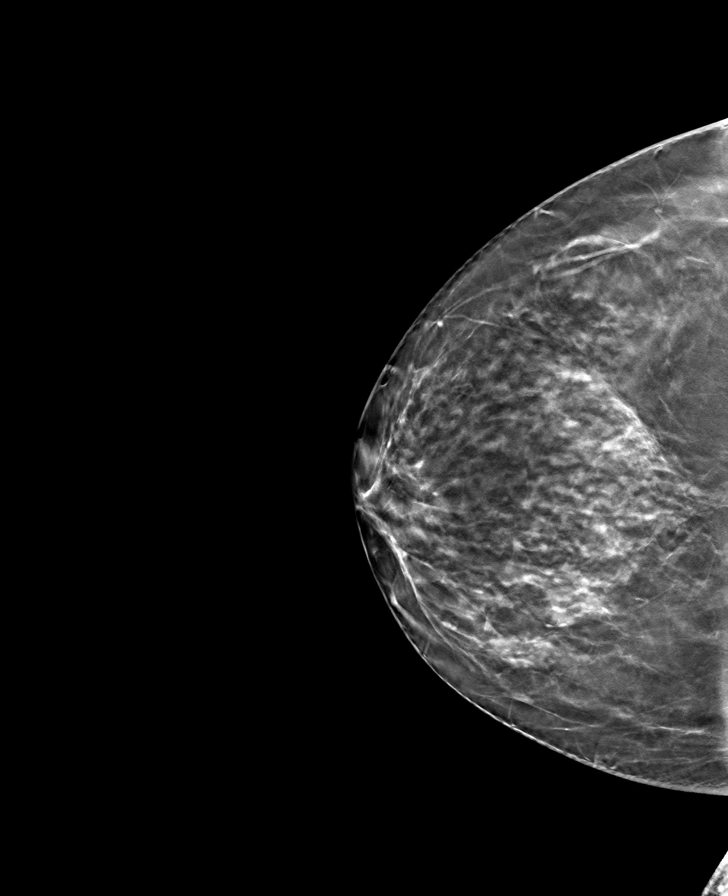

[R MLO tomo · tomo slice 43/85.0]
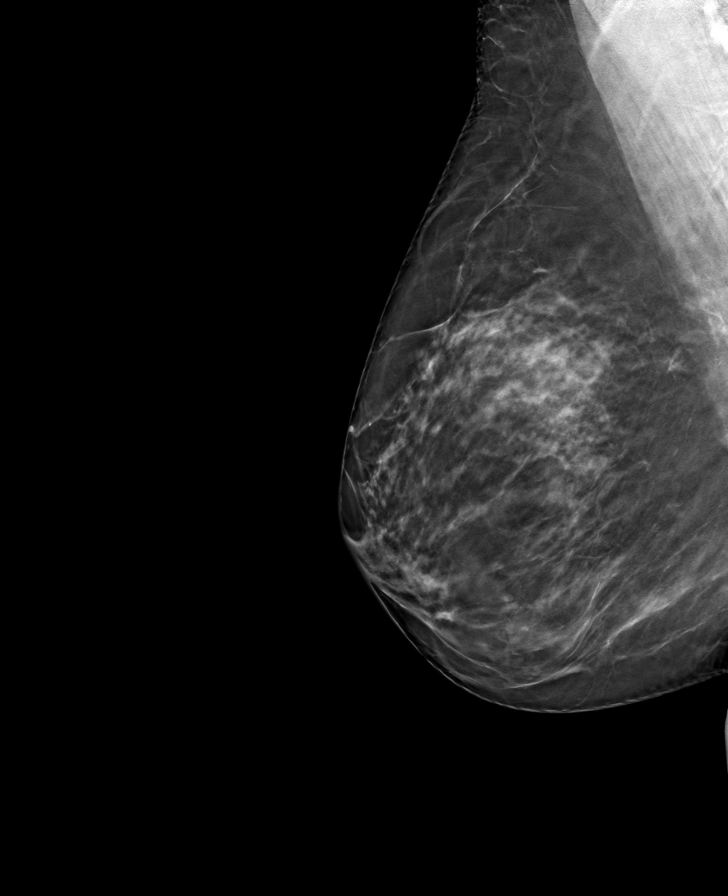

[L MLO tomo · tomo slice 44/87.0]
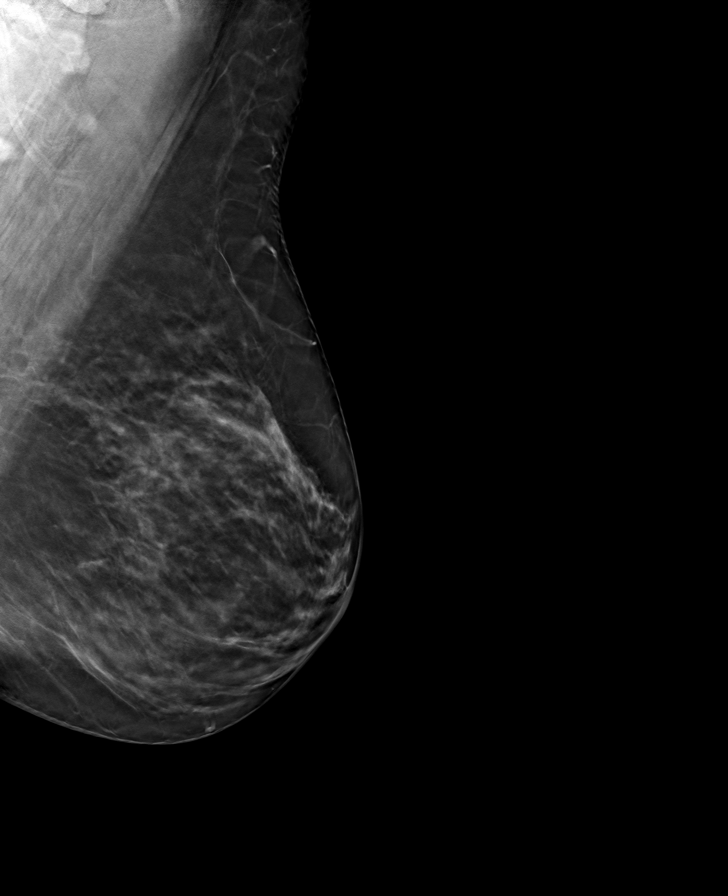

[L CC tomo · tomo slice 42/83.0]
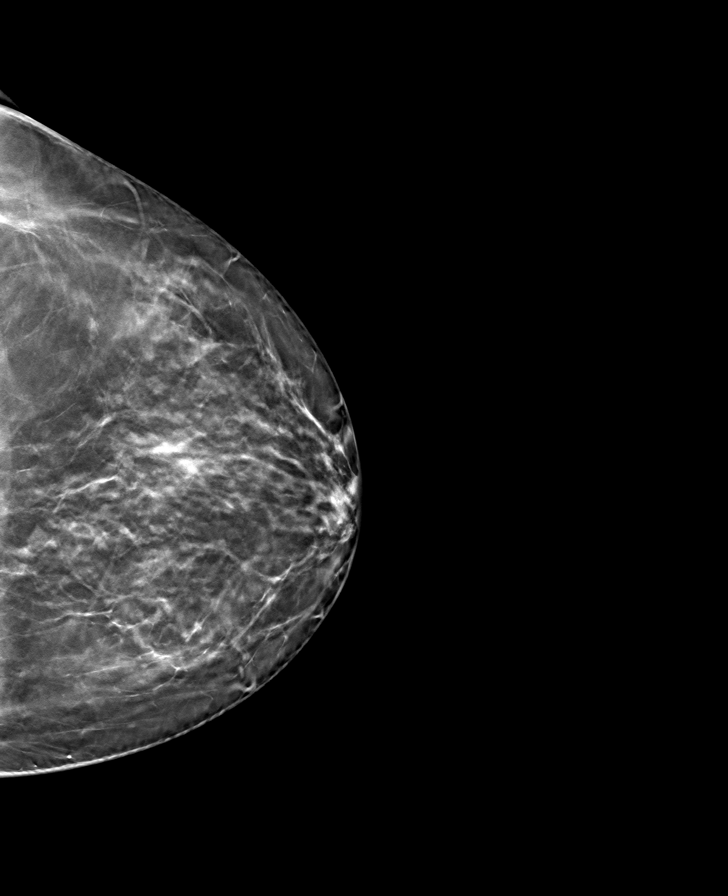

[8 of 24 positions shown; findings below may reference images not displayed]

ACR Breast Density Category c: The breast tissue is heterogeneously
dense, which may obscure small masses.
FINDINGS: There are no findings suspicious for malignancy.
IMPRESSION: No mammographic evidence of malignancy. A result letter of this
screening mammogram will be mailed directly to the patient.

RECOMMENDATION:
Screening mammogram in one year. (Code:Q3-W-BC3)

BI-RADS CATEGORY  1: Negative.

## 2022-11-14 ENCOUNTER — Encounter: Payer: Self-pay | Admitting: Obstetrics & Gynecology

## 2022-11-14 ENCOUNTER — Other Ambulatory Visit: Payer: Self-pay | Admitting: Obstetrics & Gynecology

## 2022-11-14 NOTE — Telephone Encounter (Signed)
Refill request from pharmacy has been sent to Dr. Simon Rhein inbox.

## 2022-11-14 NOTE — Telephone Encounter (Signed)
AEX 518/23 Scheduled 12/21/2022

## 2022-11-15 NOTE — Telephone Encounter (Signed)
Dr. ML-Patient has contacted me multiple times about you refilling this Rx asap.  Thanks

## 2022-12-21 ENCOUNTER — Ambulatory Visit: Payer: 59 | Admitting: Obstetrics & Gynecology

## 2023-01-13 ENCOUNTER — Encounter: Payer: Self-pay | Admitting: Obstetrics & Gynecology

## 2023-01-13 ENCOUNTER — Ambulatory Visit (INDEPENDENT_AMBULATORY_CARE_PROVIDER_SITE_OTHER): Payer: 59 | Admitting: Obstetrics & Gynecology

## 2023-01-13 ENCOUNTER — Other Ambulatory Visit (HOSPITAL_COMMUNITY)
Admission: RE | Admit: 2023-01-13 | Discharge: 2023-01-13 | Disposition: A | Discharge status: Home / Self Care | Payer: 59 | Source: Ambulatory Visit | Attending: Obstetrics & Gynecology | Admitting: Obstetrics & Gynecology

## 2023-01-13 VITALS — BP 110/78 | HR 80 | Resp 18 | Ht 66.73 in | Wt 191.4 lb

## 2023-01-13 DIAGNOSIS — Z01419 Encounter for gynecological examination (general) (routine) without abnormal findings: Secondary | ICD-10-CM | POA: Insufficient documentation

## 2023-01-13 DIAGNOSIS — Z789 Other specified health status: Secondary | ICD-10-CM

## 2023-01-13 DIAGNOSIS — Z9889 Other specified postprocedural states: Secondary | ICD-10-CM | POA: Diagnosis present

## 2023-01-13 NOTE — Progress Notes (Signed)
Helen Schmitt [birth year] [id number]   History:    47 y.o. G0 Married Moved back to [place] x 3 years.   RP:  Established patient presenting for annual gyn exam    HPI: Menses regular every month with normal flow.  No breakthrough bleeding.  No pelvic pain. Using condoms.  No pain with intercourse when using a lubricant. Pap Neg 10/2020. Urine and bowel movements normal. Breasts normal. Mammo Neg 09/2021.  Schedule Mammo now.  Remote history of LEEPs, per patient around 2007. Pap tests normal since then. Pap Neg 11/2021. Pap reflex today. BMI 30.22. Good nutrition and fitness.  Recommend Colono.  Health labs with Fam Schmitt.   Past medical history,surgical history, family history and social history were all reviewed and documented in the EPIC chart.  Gynecologic History Patient's last menstrual period was 12/30/2022 (exact date).  Obstetric History OB History  Gravida Para Term Preterm AB Living  0            SAB IAB Ectopic Multiple Live Births                ROS: A ROS was performed and pertinent positives and negatives are included in the history. GENERAL: No fevers or chills. HEENT: No change in vision, no earache, sore throat or sinus congestion. NECK: No pain or stiffness. CARDIOVASCULAR: No chest pain or pressure. No palpitations. PULMONARY: No shortness of breath, cough or wheeze. GASTROINTESTINAL: No abdominal pain, nausea, vomiting or diarrhea, melena or bright red blood per rectum. GENITOURINARY: No urinary frequency, urgency, hesitancy or dysuria. MUSCULOSKELETAL: No joint or muscle pain, no back pain, no recent trauma. DERMATOLOGIC: No rash, no itching, no lesions. ENDOCRINE: No polyuria, polydipsia, no heat or cold intolerance. No recent change in weight. HEMATOLOGICAL: No anemia or easy bruising or bleeding. NEUROLOGIC: No headache, seizures, numbness, tingling or weakness. PSYCHIATRIC: No depression, no loss of interest in normal activity or change in sleep pattern.      Exam:   BP 110/78 (BP Location: Right Arm, Patient Position: Sitting)   Pulse 80   Resp 18   Ht 5' 6.73" (1.695 m)   Wt 191 lb 6.4 oz (86.8 kg)   LMP 12/30/2022 (Exact Date)   SpO2 97%   BMI 30.22 kg/m   Body mass index is 30.22 kg/m.  General appearance : Well developed well nourished female. No acute distress HEENT: Eyes: no retinal hemorrhage or exudates,  Neck supple, trachea midline, no carotid bruits, no thyroidmegaly Lungs: Clear to auscultation, no rhonchi or wheezes, or rib retractions  Heart: Regular rate and rhythm, no murmurs or gallops Breast:Examined in sitting and supine position were symmetrical in appearance, no palpable masses or tenderness,  no skin retraction, no nipple inversion, no nipple discharge, no skin discoloration, no axillary or supraclavicular lymphadenopathy Abdomen: no palpable masses or tenderness, no rebound or guarding Extremities: no edema or skin discoloration or tenderness  Pelvic: Vulva: Normal             Vagina: No gross lesions or discharge  Cervix: No gross lesions or discharge.  Pap reflex done.  Uterus  AV, normal size, shape and consistency, non-tender and mobile  Adnexa  Without masses or tenderness  Anus: Normal   Assessment/Plan:  47 y.o. female for annual exam   1. Encounter for routine gynecological examination with Papanicolaou smear of cervix Menses regular every month with normal flow.  No breakthrough bleeding.  No pelvic pain. Using condoms.  No pain with intercourse when using a  lubricant. Pap Neg 10/2020. Urine and bowel movements normal. Breasts normal. Mammo Neg 09/2021.  Schedule Mammo now.  Remote history of LEEPs, per patient around 2007. Pap tests normal since then. Pap Neg 11/2021. Pap reflex today. BMI 30.22. Good nutrition and fitness.  Recommend Colono.  Health labs with Fam Schmitt. - Cytology - PAP( [facility])  2. H/O LEEP - Cytology - PAP( [place])  3. Use of condoms for contraception   Helen Schmitt, 11:31 AM

## 2023-01-17 LAB — CYTOLOGY - PAP
Adequacy: ABSENT
Diagnosis: NEGATIVE

## 2023-08-14 ENCOUNTER — Other Ambulatory Visit: Payer: Self-pay | Admitting: Medical Genetics

## 2023-10-02 ENCOUNTER — Other Ambulatory Visit: Payer: Self-pay | Admitting: Obstetrics & Gynecology

## 2023-10-02 NOTE — Telephone Encounter (Signed)
 Medication refill request: xanax Last AEX:  01/13/23 Next AEX: 02/21/24 Last MMG (if hormonal medication request): n/a Refill authorized: patient called requesting refill for travel.

## 2023-10-03 ENCOUNTER — Encounter (HOSPITAL_BASED_OUTPATIENT_CLINIC_OR_DEPARTMENT_OTHER): Payer: Self-pay | Admitting: Obstetrics & Gynecology

## 2023-10-03 DIAGNOSIS — Z1231 Encounter for screening mammogram for malignant neoplasm of breast: Secondary | ICD-10-CM

## 2023-10-04 ENCOUNTER — Encounter (HOSPITAL_BASED_OUTPATIENT_CLINIC_OR_DEPARTMENT_OTHER): Payer: Self-pay | Admitting: Obstetrics & Gynecology

## 2023-10-04 DIAGNOSIS — Z1231 Encounter for screening mammogram for malignant neoplasm of breast: Secondary | ICD-10-CM

## 2023-10-06 ENCOUNTER — Encounter (HOSPITAL_BASED_OUTPATIENT_CLINIC_OR_DEPARTMENT_OTHER): Payer: Self-pay | Admitting: Obstetrics & Gynecology

## 2023-10-06 ENCOUNTER — Inpatient Hospital Stay (HOSPITAL_BASED_OUTPATIENT_CLINIC_OR_DEPARTMENT_OTHER): Admission: RE | Admit: 2023-10-06 | Source: Ambulatory Visit | Admitting: Radiology

## 2023-10-06 ENCOUNTER — Encounter (HOSPITAL_BASED_OUTPATIENT_CLINIC_OR_DEPARTMENT_OTHER): Payer: Self-pay

## 2023-10-06 ENCOUNTER — Ambulatory Visit (HOSPITAL_BASED_OUTPATIENT_CLINIC_OR_DEPARTMENT_OTHER)
Admission: RE | Admit: 2023-10-06 | Discharge: 2023-10-06 | Disposition: A | Discharge status: Home / Self Care | Source: Ambulatory Visit | Attending: Obstetrics and Gynecology | Admitting: Obstetrics and Gynecology

## 2023-10-06 ENCOUNTER — Encounter (HOSPITAL_BASED_OUTPATIENT_CLINIC_OR_DEPARTMENT_OTHER): Payer: Self-pay | Admitting: Radiology

## 2023-10-06 ENCOUNTER — Other Ambulatory Visit (HOSPITAL_BASED_OUTPATIENT_CLINIC_OR_DEPARTMENT_OTHER): Payer: Self-pay | Admitting: Obstetrics and Gynecology

## 2023-10-06 DIAGNOSIS — Z1231 Encounter for screening mammogram for malignant neoplasm of breast: Secondary | ICD-10-CM | POA: Diagnosis present

## 2023-10-12 ENCOUNTER — Encounter: Payer: Self-pay | Admitting: Obstetrics and Gynecology

## 2024-02-21 ENCOUNTER — Ambulatory Visit: Admitting: Obstetrics and Gynecology

## 2024-03-13 ENCOUNTER — Ambulatory Visit: Admitting: Obstetrics and Gynecology

## 2024-05-22 ENCOUNTER — Other Ambulatory Visit: Payer: Self-pay | Admitting: Medical Genetics

## 2024-05-22 DIAGNOSIS — Z006 Encounter for examination for normal comparison and control in clinical research program: Secondary | ICD-10-CM

## 2024-07-16 LAB — GENECONNECT MOLECULAR SCREEN: Genetic Analysis Overall Interpretation: NEGATIVE

## 2025-03-19 ENCOUNTER — Ambulatory Visit: Admitting: Obstetrics and Gynecology
